# Patient Record
Sex: Male | Born: 1980
Health system: Southern US, Community
[De-identification: ages and names within clinical notes are randomized; demographics above are authoritative.]

## PROBLEM LIST (undated history)

## (undated) DIAGNOSIS — F32A Depression, unspecified: Secondary | ICD-10-CM

## (undated) HISTORY — PX: EYE SURGERY: SHX253

---

## 2016-07-03 DIAGNOSIS — Z6826 Body mass index (BMI) 26.0-26.9, adult: Secondary | ICD-10-CM | POA: Diagnosis not present

## 2016-07-03 DIAGNOSIS — G4700 Insomnia, unspecified: Secondary | ICD-10-CM | POA: Diagnosis not present

## 2016-07-03 DIAGNOSIS — F4010 Social phobia, unspecified: Secondary | ICD-10-CM | POA: Diagnosis not present

## 2016-07-03 DIAGNOSIS — F200 Paranoid schizophrenia: Secondary | ICD-10-CM | POA: Diagnosis not present

## 2016-07-09 DIAGNOSIS — F200 Paranoid schizophrenia: Secondary | ICD-10-CM | POA: Diagnosis not present

## 2016-07-23 DIAGNOSIS — H5213 Myopia, bilateral: Secondary | ICD-10-CM | POA: Diagnosis not present

## 2016-10-29 DIAGNOSIS — F200 Paranoid schizophrenia: Secondary | ICD-10-CM | POA: Diagnosis not present

## 2017-11-26 DIAGNOSIS — G2571 Drug induced akathisia: Secondary | ICD-10-CM | POA: Diagnosis not present

## 2017-11-26 DIAGNOSIS — F200 Paranoid schizophrenia: Secondary | ICD-10-CM | POA: Diagnosis not present

## 2017-12-27 DIAGNOSIS — H1031 Unspecified acute conjunctivitis, right eye: Secondary | ICD-10-CM | POA: Diagnosis not present

## 2018-05-20 DIAGNOSIS — Z131 Encounter for screening for diabetes mellitus: Secondary | ICD-10-CM | POA: Diagnosis not present

## 2018-05-20 DIAGNOSIS — Z0000 Encounter for general adult medical examination without abnormal findings: Secondary | ICD-10-CM | POA: Diagnosis not present

## 2018-06-03 DIAGNOSIS — F209 Schizophrenia, unspecified: Secondary | ICD-10-CM | POA: Diagnosis not present

## 2018-06-03 DIAGNOSIS — G4700 Insomnia, unspecified: Secondary | ICD-10-CM | POA: Diagnosis not present

## 2018-06-03 DIAGNOSIS — F4010 Social phobia, unspecified: Secondary | ICD-10-CM | POA: Diagnosis not present

## 2018-06-03 DIAGNOSIS — Z6826 Body mass index (BMI) 26.0-26.9, adult: Secondary | ICD-10-CM | POA: Diagnosis not present

## 2018-06-17 DIAGNOSIS — D2262 Melanocytic nevi of left upper limb, including shoulder: Secondary | ICD-10-CM | POA: Diagnosis not present

## 2018-06-17 DIAGNOSIS — D1801 Hemangioma of skin and subcutaneous tissue: Secondary | ICD-10-CM | POA: Diagnosis not present

## 2018-06-17 DIAGNOSIS — D1722 Benign lipomatous neoplasm of skin and subcutaneous tissue of left arm: Secondary | ICD-10-CM | POA: Diagnosis not present

## 2018-06-17 DIAGNOSIS — D485 Neoplasm of uncertain behavior of skin: Secondary | ICD-10-CM | POA: Diagnosis not present

## 2018-06-17 DIAGNOSIS — L821 Other seborrheic keratosis: Secondary | ICD-10-CM | POA: Diagnosis not present

## 2018-06-17 DIAGNOSIS — L814 Other melanin hyperpigmentation: Secondary | ICD-10-CM | POA: Diagnosis not present

## 2018-10-28 DIAGNOSIS — F209 Schizophrenia, unspecified: Secondary | ICD-10-CM | POA: Diagnosis not present

## 2018-10-28 DIAGNOSIS — G4700 Insomnia, unspecified: Secondary | ICD-10-CM | POA: Diagnosis not present

## 2018-10-28 DIAGNOSIS — F4010 Social phobia, unspecified: Secondary | ICD-10-CM | POA: Diagnosis not present

## 2018-10-28 DIAGNOSIS — G2571 Drug induced akathisia: Secondary | ICD-10-CM | POA: Diagnosis not present

## 2018-10-31 NOTE — Progress Notes (Signed)
Mr. Melley received his flu shot to the RT deltoid at the [facility] by the undersigned.   Lot #[id number] NDC: [id number] Mfg: [id number] Exp. 03/14/19

## 2019-04-28 DIAGNOSIS — F209 Schizophrenia, unspecified: Secondary | ICD-10-CM | POA: Diagnosis not present

## 2019-04-28 DIAGNOSIS — G2571 Drug induced akathisia: Secondary | ICD-10-CM | POA: Diagnosis not present

## 2019-05-10 DIAGNOSIS — Z20828 Contact with and (suspected) exposure to other viral communicable diseases: Secondary | ICD-10-CM | POA: Diagnosis not present

## 2019-06-15 DIAGNOSIS — Z0000 Encounter for general adult medical examination without abnormal findings: Secondary | ICD-10-CM | POA: Diagnosis not present

## 2019-06-17 DIAGNOSIS — Z20828 Contact with and (suspected) exposure to other viral communicable diseases: Secondary | ICD-10-CM | POA: Diagnosis not present

## 2019-06-23 DIAGNOSIS — Z23 Encounter for immunization: Secondary | ICD-10-CM | POA: Diagnosis not present

## 2019-07-05 DIAGNOSIS — Z13220 Encounter for screening for lipoid disorders: Secondary | ICD-10-CM | POA: Diagnosis not present

## 2019-07-05 DIAGNOSIS — Z0000 Encounter for general adult medical examination without abnormal findings: Secondary | ICD-10-CM | POA: Diagnosis not present

## 2019-08-07 DIAGNOSIS — Z20828 Contact with and (suspected) exposure to other viral communicable diseases: Secondary | ICD-10-CM | POA: Diagnosis not present

## 2019-08-08 DIAGNOSIS — Z20828 Contact with and (suspected) exposure to other viral communicable diseases: Secondary | ICD-10-CM | POA: Diagnosis not present

## 2019-08-25 DIAGNOSIS — R69 Illness, unspecified: Secondary | ICD-10-CM | POA: Diagnosis not present

## 2019-08-25 DIAGNOSIS — G4700 Insomnia, unspecified: Secondary | ICD-10-CM | POA: Diagnosis not present

## 2019-08-25 DIAGNOSIS — G2571 Drug induced akathisia: Secondary | ICD-10-CM | POA: Diagnosis not present

## 2019-09-13 ENCOUNTER — Ambulatory Visit: Payer: 59 | Admitting: Mental Health

## 2019-09-27 DIAGNOSIS — R69 Illness, unspecified: Secondary | ICD-10-CM | POA: Diagnosis not present

## 2019-10-12 DIAGNOSIS — R69 Illness, unspecified: Secondary | ICD-10-CM | POA: Diagnosis not present

## 2019-10-26 DIAGNOSIS — R69 Illness, unspecified: Secondary | ICD-10-CM | POA: Diagnosis not present

## 2019-10-27 DIAGNOSIS — R69 Illness, unspecified: Secondary | ICD-10-CM | POA: Diagnosis not present

## 2019-10-27 DIAGNOSIS — G2571 Drug induced akathisia: Secondary | ICD-10-CM | POA: Diagnosis not present

## 2019-10-27 DIAGNOSIS — G4700 Insomnia, unspecified: Secondary | ICD-10-CM | POA: Diagnosis not present

## 2019-11-09 DIAGNOSIS — R69 Illness, unspecified: Secondary | ICD-10-CM | POA: Diagnosis not present

## 2019-11-10 DIAGNOSIS — L814 Other melanin hyperpigmentation: Secondary | ICD-10-CM | POA: Diagnosis not present

## 2019-11-10 DIAGNOSIS — D2262 Melanocytic nevi of left upper limb, including shoulder: Secondary | ICD-10-CM | POA: Diagnosis not present

## 2019-11-10 DIAGNOSIS — D2271 Melanocytic nevi of right lower limb, including hip: Secondary | ICD-10-CM | POA: Diagnosis not present

## 2019-11-10 DIAGNOSIS — D2261 Melanocytic nevi of right upper limb, including shoulder: Secondary | ICD-10-CM | POA: Diagnosis not present

## 2019-11-10 DIAGNOSIS — D225 Melanocytic nevi of trunk: Secondary | ICD-10-CM | POA: Diagnosis not present

## 2019-11-10 DIAGNOSIS — D1801 Hemangioma of skin and subcutaneous tissue: Secondary | ICD-10-CM | POA: Diagnosis not present

## 2019-11-10 DIAGNOSIS — L820 Inflamed seborrheic keratosis: Secondary | ICD-10-CM | POA: Diagnosis not present

## 2019-11-10 DIAGNOSIS — L821 Other seborrheic keratosis: Secondary | ICD-10-CM | POA: Diagnosis not present

## 2019-11-30 DIAGNOSIS — R69 Illness, unspecified: Secondary | ICD-10-CM | POA: Diagnosis not present

## 2019-12-08 DIAGNOSIS — G2571 Drug induced akathisia: Secondary | ICD-10-CM | POA: Diagnosis not present

## 2019-12-08 DIAGNOSIS — G4700 Insomnia, unspecified: Secondary | ICD-10-CM | POA: Diagnosis not present

## 2019-12-08 DIAGNOSIS — R69 Illness, unspecified: Secondary | ICD-10-CM | POA: Diagnosis not present

## 2019-12-21 DIAGNOSIS — R69 Illness, unspecified: Secondary | ICD-10-CM | POA: Diagnosis not present

## 2020-01-03 DIAGNOSIS — R69 Illness, unspecified: Secondary | ICD-10-CM | POA: Diagnosis not present

## 2020-01-03 DIAGNOSIS — Z8659 Personal history of other mental and behavioral disorders: Secondary | ICD-10-CM | POA: Diagnosis not present

## 2020-01-19 DIAGNOSIS — M79642 Pain in left hand: Secondary | ICD-10-CM | POA: Diagnosis not present

## 2020-01-19 DIAGNOSIS — M79641 Pain in right hand: Secondary | ICD-10-CM | POA: Diagnosis not present

## 2020-01-25 DIAGNOSIS — R69 Illness, unspecified: Secondary | ICD-10-CM | POA: Diagnosis not present

## 2020-01-26 DIAGNOSIS — R69 Illness, unspecified: Secondary | ICD-10-CM | POA: Diagnosis not present

## 2020-01-26 DIAGNOSIS — G2571 Drug induced akathisia: Secondary | ICD-10-CM | POA: Diagnosis not present

## 2020-02-02 DIAGNOSIS — R69 Illness, unspecified: Secondary | ICD-10-CM | POA: Diagnosis not present

## 2020-02-15 DIAGNOSIS — R69 Illness, unspecified: Secondary | ICD-10-CM | POA: Diagnosis not present

## 2020-02-27 DIAGNOSIS — F419 Anxiety disorder, unspecified: Secondary | ICD-10-CM | POA: Diagnosis not present

## 2020-02-27 DIAGNOSIS — R69 Illness, unspecified: Secondary | ICD-10-CM | POA: Diagnosis not present

## 2020-02-27 DIAGNOSIS — G2571 Drug induced akathisia: Secondary | ICD-10-CM | POA: Diagnosis not present

## 2020-02-29 DIAGNOSIS — R69 Illness, unspecified: Secondary | ICD-10-CM | POA: Diagnosis not present

## 2020-03-27 DIAGNOSIS — K649 Unspecified hemorrhoids: Secondary | ICD-10-CM | POA: Diagnosis not present

## 2020-04-05 DIAGNOSIS — R69 Illness, unspecified: Secondary | ICD-10-CM | POA: Diagnosis not present

## 2020-04-23 DIAGNOSIS — R05 Cough: Secondary | ICD-10-CM | POA: Diagnosis not present

## 2020-04-23 DIAGNOSIS — R509 Fever, unspecified: Secondary | ICD-10-CM | POA: Diagnosis not present

## 2020-04-23 DIAGNOSIS — Z03818 Encounter for observation for suspected exposure to other biological agents ruled out: Secondary | ICD-10-CM | POA: Diagnosis not present

## 2020-04-23 DIAGNOSIS — J029 Acute pharyngitis, unspecified: Secondary | ICD-10-CM | POA: Diagnosis not present

## 2020-05-09 DIAGNOSIS — R509 Fever, unspecified: Secondary | ICD-10-CM | POA: Diagnosis not present

## 2020-05-09 DIAGNOSIS — R112 Nausea with vomiting, unspecified: Secondary | ICD-10-CM | POA: Diagnosis not present

## 2020-05-09 DIAGNOSIS — R5381 Other malaise: Secondary | ICD-10-CM | POA: Diagnosis not present

## 2020-05-17 DIAGNOSIS — R69 Illness, unspecified: Secondary | ICD-10-CM | POA: Diagnosis not present

## 2020-06-17 DIAGNOSIS — M25541 Pain in joints of right hand: Secondary | ICD-10-CM | POA: Diagnosis not present

## 2020-06-17 DIAGNOSIS — Z0000 Encounter for general adult medical examination without abnormal findings: Secondary | ICD-10-CM | POA: Diagnosis not present

## 2020-06-17 DIAGNOSIS — Z23 Encounter for immunization: Secondary | ICD-10-CM | POA: Diagnosis not present

## 2020-06-17 DIAGNOSIS — M549 Dorsalgia, unspecified: Secondary | ICD-10-CM | POA: Diagnosis not present

## 2020-06-17 DIAGNOSIS — M25542 Pain in joints of left hand: Secondary | ICD-10-CM | POA: Diagnosis not present

## 2020-06-19 DIAGNOSIS — R69 Illness, unspecified: Secondary | ICD-10-CM | POA: Diagnosis not present

## 2020-07-05 DIAGNOSIS — Z23 Encounter for immunization: Secondary | ICD-10-CM | POA: Diagnosis not present

## 2020-07-05 DIAGNOSIS — M25541 Pain in joints of right hand: Secondary | ICD-10-CM | POA: Diagnosis not present

## 2020-07-05 DIAGNOSIS — M25542 Pain in joints of left hand: Secondary | ICD-10-CM | POA: Diagnosis not present

## 2020-07-05 DIAGNOSIS — Z0000 Encounter for general adult medical examination without abnormal findings: Secondary | ICD-10-CM | POA: Diagnosis not present

## 2020-07-05 DIAGNOSIS — Z13220 Encounter for screening for lipoid disorders: Secondary | ICD-10-CM | POA: Diagnosis not present

## 2020-07-05 DIAGNOSIS — M549 Dorsalgia, unspecified: Secondary | ICD-10-CM | POA: Diagnosis not present

## 2020-08-15 DIAGNOSIS — R69 Illness, unspecified: Secondary | ICD-10-CM | POA: Diagnosis not present

## 2020-09-12 DIAGNOSIS — M7989 Other specified soft tissue disorders: Secondary | ICD-10-CM | POA: Diagnosis not present

## 2020-09-16 DIAGNOSIS — R059 Cough, unspecified: Secondary | ICD-10-CM | POA: Diagnosis not present

## 2020-09-16 DIAGNOSIS — Z03818 Encounter for observation for suspected exposure to other biological agents ruled out: Secondary | ICD-10-CM | POA: Diagnosis not present

## 2020-09-16 DIAGNOSIS — Z7189 Other specified counseling: Secondary | ICD-10-CM | POA: Diagnosis not present

## 2020-10-17 DIAGNOSIS — R69 Illness, unspecified: Secondary | ICD-10-CM | POA: Diagnosis not present

## 2020-10-18 DIAGNOSIS — R69 Illness, unspecified: Secondary | ICD-10-CM | POA: Diagnosis not present

## 2020-11-08 ENCOUNTER — Other Ambulatory Visit: Payer: Self-pay

## 2020-11-08 ENCOUNTER — Encounter (HOSPITAL_COMMUNITY): Payer: Self-pay

## 2020-11-08 ENCOUNTER — Ambulatory Visit (HOSPITAL_COMMUNITY)
Admission: EM | Admit: 2020-11-08 | Discharge: 2020-11-08 | Disposition: A | Discharge status: Home / Self Care | Payer: 59

## 2020-11-08 DIAGNOSIS — K529 Noninfective gastroenteritis and colitis, unspecified: Secondary | ICD-10-CM

## 2020-11-08 DIAGNOSIS — R112 Nausea with vomiting, unspecified: Secondary | ICD-10-CM | POA: Diagnosis not present

## 2020-11-08 DIAGNOSIS — R197 Diarrhea, unspecified: Secondary | ICD-10-CM

## 2020-11-08 MED ORDER — ONDANSETRON 4 MG PO TBDP: 4.0000 mg | ORAL_TABLET | Freq: Three times a day (TID) | ORAL | 0 refills | Status: DC | PRN

## 2020-11-08 MED ORDER — LOPERAMIDE HCL 2 MG PO CAPS: 2.0000 mg | ORAL_CAPSULE | Freq: Four times a day (QID) | ORAL | 0 refills | Status: DC | PRN

## 2020-11-08 NOTE — ED Provider Notes (Signed)
[facility]    CSN: [id number] Arrival date & time: 11/08/20  1004      History   Chief Complaint Chief Complaint  Patient presents with  . Abdominal Pain  . Emesis  . Diarrhea    HPI Glenn Hodge is a 40 y.o. male presenting with generalized crampy abdominal pain, vomiting, diarrhea for 12 hours.  States he has already taken Zofran ODT 4 mg, tylenol, and Imodium for the symptoms, with relief of symptoms.  He states he is feeling well right now.  Denies URI symptoms, and states that he had Covid 1 month ago. Denies current fevers/chills, n/v/d, shortness of breath, chest pain, cough, congestion, facial pain, teeth pain, headaches, sore throat, loss of taste/smell, swollen lymph nodes, ear pain. Requesting refill of zofran and immodium in case symptoms return.   HPI  History reviewed. No pertinent past medical history.  There are no problems to display for this patient.   History reviewed. No pertinent surgical history.     Home Medications    Prior to Admission medications   Medication Sig Start Date End Date Taking? Authorizing Provider  ARIPiprazole (ABILIFY) 10 MG tablet aripiprazole 10 mg tablet 08/25/20  Yes [provider]  Doxepin HCl 3 MG TABS doxepin 3 mg tablet 08/26/20  Yes [provider]  loperamide (IMODIUM) 2 MG capsule Take 1 capsule (2 mg total) by mouth 4 (four) times daily as needed for diarrhea or loose stools. 11/08/20  Yes Rhys Martini, PA-C  ondansetron (ZOFRAN ODT) 4 MG disintegrating tablet Take 1 tablet (4 mg total) by mouth every 8 (eight) hours as needed for nausea or vomiting. 11/08/20  Yes Rhys Martini, PA-C    Family History History reviewed. No pertinent family history.  Social History Social History   Tobacco Use  . Smoking status: Never Smoker  . Smokeless tobacco: Never Used  Substance Use Topics  . Drug use: Never     Allergies   Patient has no known allergies.   Review of Systems Review  of Systems  Constitutional: Negative for appetite change, chills and fever.  HENT: Negative for congestion, ear pain, rhinorrhea, sinus pressure, sinus pain and sore throat.   Eyes: Negative for redness and visual disturbance.  Respiratory: Negative for cough, chest tightness, shortness of breath and wheezing.   Cardiovascular: Negative for chest pain and palpitations.  Gastrointestinal: Positive for abdominal pain, diarrhea, nausea and vomiting. Negative for constipation.  Genitourinary: Negative for dysuria, frequency and urgency.  Musculoskeletal: Negative for myalgias.  Neurological: Negative for dizziness, weakness and headaches.  Psychiatric/Behavioral: Negative for confusion.  All other systems reviewed and are negative.    Physical Exam Triage Vital Signs ED Triage Vitals  Enc Vitals Group     BP 11/08/20 1037 121/68     Pulse Rate 11/08/20 1037 89     Resp 11/08/20 1037 19     Temp 11/08/20 1037 98.3 F (36.8 C)     Temp src --      SpO2 11/08/20 1037 99 %     Weight --      Height --      Head Circumference --      Peak Flow --      Pain Score 11/08/20 1035 1     Pain Loc --      Pain Edu? --      Excl. in GC? --    No data found.  Updated Vital Signs BP 121/68   Pulse  89   Temp 98.3 F (36.8 C)   Resp 19   SpO2 99%   Visual Acuity Right Eye Distance:   Left Eye Distance:   Bilateral Distance:    Right Eye Near:   Left Eye Near:    Bilateral Near:     Physical Exam Vitals reviewed.  Constitutional:      General: He is not in acute distress.    Appearance: Normal appearance. He is not ill-appearing.  HENT:     Head: Normocephalic and atraumatic.  Cardiovascular:     Rate and Rhythm: Normal rate and regular rhythm.     Heart sounds: Normal heart sounds.  Pulmonary:     Effort: Pulmonary effort is normal.     Breath sounds: Normal breath sounds. No wheezing, rhonchi or rales.  Abdominal:     General: Bowel sounds are normal. There is no  distension.     Palpations: Abdomen is soft. There is no mass.     Tenderness: There is no abdominal tenderness. There is no right CVA tenderness, left CVA tenderness, guarding or rebound. Negative signs include Murphy's sign, Rovsing's sign and McBurney's sign.  Neurological:     General: No focal deficit present.     Mental Status: He is alert and oriented to person, place, and time.  Psychiatric:        Mood and Affect: Mood normal.        Behavior: Behavior normal.      UC Treatments / Results  Labs (all labs ordered are listed, but only abnormal results are displayed) Labs Reviewed - No data to display  EKG   Radiology No results found.  Procedures Procedures (including critical care time)  Medications Ordered in UC Medications - No data to display  Initial Impression / Assessment and Plan / UC Course  I have reviewed the triage vital signs and the nursing notes.  Pertinent labs & imaging results that were available during my care of the patient were reviewed by me and considered in my medical decision making (see chart for details).     This patient is a 40 year old male presenting with viral gastroenteritis.  Today he is afebrile nontachycardic and nontachypneic. zofran ODT and imodium providing relief; refills sent as below. Continue these. rec good hydration.    This patient had  covid-19 1 month ago and is fully vaccinated for covid, so additional test deferred as per CDC guidelines.   Return precautions-inability to hydrate by mouth, worsening of symptoms despite treatment, focal abdominal pain, new fever/chills, etc.  Spent over 40 minutes obtaining H&P, performing physical, discussing results, treatment plan and plan for follow-up with patient. Patient agrees with plan.     Final Clinical Impressions(s) / UC Diagnoses   Final diagnoses:  Gastroenteritis  Nausea vomiting and diarrhea     Discharge Instructions     -Continue Zofran for  nausea/vomiting, dissolve 1 pill under your tongue every 4-8 hours as needed. -Also continue imodium, up to 4x daily for diarrea. -You can also continue OTC medications like Tums or Pepto Bismol. -Make sure to drink plenty of fluids and eat a bland diet as tolerated.    ED Prescriptions    Medication Sig Dispense Auth. Provider   ondansetron (ZOFRAN ODT) 4 MG disintegrating tablet Take 1 tablet (4 mg total) by mouth every 8 (eight) hours as needed for nausea or vomiting. 21 tablet Rhys Martini, PA-C   loperamide (IMODIUM) 2 MG capsule Take 1 capsule (2 mg total)  by mouth 4 (four) times daily as needed for diarrhea or loose stools. 21 capsule Rhys Martini, PA-C     PDMP not reviewed this encounter.   Rhys Martini, PA-C 11/08/20 1121

## 2020-11-08 NOTE — ED Triage Notes (Signed)
Pt in with c/o generalized abdominal pain and vomiting that started last night and is now resolving   Pt took tylenol and zofran and imodium this morning for sxs

## 2020-11-08 NOTE — Discharge Instructions (Addendum)
-  Continue Zofran for nausea/vomiting, dissolve 1 pill under your tongue every 4-8 hours as needed. -Also continue imodium, up to 4x daily for diarrea. -You can also continue OTC medications like Tums or Pepto Bismol. -Make sure to drink plenty of fluids and eat a bland diet as tolerated.

## 2020-11-21 DIAGNOSIS — R69 Illness, unspecified: Secondary | ICD-10-CM | POA: Diagnosis not present

## 2020-12-12 DIAGNOSIS — M25511 Pain in right shoulder: Secondary | ICD-10-CM | POA: Diagnosis not present

## 2021-05-02 ENCOUNTER — Encounter (HOSPITAL_BASED_OUTPATIENT_CLINIC_OR_DEPARTMENT_OTHER): Payer: Self-pay | Admitting: Emergency Medicine

## 2021-05-02 ENCOUNTER — Emergency Department (HOSPITAL_BASED_OUTPATIENT_CLINIC_OR_DEPARTMENT_OTHER): Payer: Managed Care, Other (non HMO)

## 2021-05-02 ENCOUNTER — Emergency Department (HOSPITAL_BASED_OUTPATIENT_CLINIC_OR_DEPARTMENT_OTHER)
Admission: EM | Admit: 2021-05-02 | Discharge: 2021-05-02 | Disposition: A | Discharge status: Home / Self Care | Payer: Managed Care, Other (non HMO) | Attending: Emergency Medicine | Admitting: Emergency Medicine

## 2021-05-02 ENCOUNTER — Other Ambulatory Visit: Payer: Self-pay

## 2021-05-02 DIAGNOSIS — R42 Dizziness and giddiness: Secondary | ICD-10-CM | POA: Diagnosis not present

## 2021-05-02 DIAGNOSIS — R251 Tremor, unspecified: Secondary | ICD-10-CM | POA: Diagnosis not present

## 2021-05-02 DIAGNOSIS — Z20822 Contact with and (suspected) exposure to covid-19: Secondary | ICD-10-CM | POA: Diagnosis not present

## 2021-05-02 DIAGNOSIS — R791 Abnormal coagulation profile: Secondary | ICD-10-CM | POA: Insufficient documentation

## 2021-05-02 HISTORY — DX: Depression, unspecified: F32.A

## 2021-05-02 LAB — PROTIME-INR
INR: 0.9 (ref 0.8–1.2)
Prothrombin Time: 12.3 seconds (ref 11.4–15.2)

## 2021-05-02 LAB — URINALYSIS, ROUTINE W REFLEX MICROSCOPIC
Bilirubin Urine: NEGATIVE
Glucose, UA: NEGATIVE mg/dL
Hgb urine dipstick: NEGATIVE
Ketones, ur: NEGATIVE mg/dL
Leukocytes,Ua: NEGATIVE
Nitrite: NEGATIVE
Protein, ur: NEGATIVE mg/dL
Specific Gravity, Urine: 1.005 (ref 1.005–1.030)
pH: 5.5 (ref 5.0–8.0)

## 2021-05-02 LAB — CBC
HCT: 47.4 % (ref 39.0–52.0)
Hemoglobin: 15.4 g/dL (ref 13.0–17.0)
MCH: 29.7 pg (ref 26.0–34.0)
MCHC: 32.5 g/dL (ref 30.0–36.0)
MCV: 91.3 fL (ref 80.0–100.0)
Platelets: 204 10*3/uL (ref 150–400)
RBC: 5.19 MIL/uL (ref 4.22–5.81)
RDW: 12.2 % (ref 11.5–15.5)
WBC: 5 10*3/uL (ref 4.0–10.5)
nRBC: 0 % (ref 0.0–0.2)

## 2021-05-02 LAB — RAPID URINE DRUG SCREEN, HOSP PERFORMED
Amphetamines: NOT DETECTED
Barbiturates: NOT DETECTED
Benzodiazepines: NOT DETECTED
Cocaine: NOT DETECTED
Opiates: NOT DETECTED
Tetrahydrocannabinol: NOT DETECTED

## 2021-05-02 LAB — DIFFERENTIAL
Abs Immature Granulocytes: 0.01 10*3/uL (ref 0.00–0.07)
Basophils Absolute: 0 10*3/uL (ref 0.0–0.1)
Basophils Relative: 1 %
Eosinophils Absolute: 0.1 10*3/uL (ref 0.0–0.5)
Eosinophils Relative: 2 %
Immature Granulocytes: 0 %
Lymphocytes Relative: 22 %
Lymphs Abs: 1.1 10*3/uL (ref 0.7–4.0)
Monocytes Absolute: 0.4 10*3/uL (ref 0.1–1.0)
Monocytes Relative: 8 %
Neutro Abs: 3.4 10*3/uL (ref 1.7–7.7)
Neutrophils Relative %: 67 %

## 2021-05-02 LAB — COMPREHENSIVE METABOLIC PANEL
ALT: 23 U/L (ref 0–44)
AST: 19 U/L (ref 15–41)
Albumin: 4.2 g/dL (ref 3.5–5.0)
Alkaline Phosphatase: 77 U/L (ref 38–126)
Anion gap: 7 (ref 5–15)
BUN: 16 mg/dL (ref 6–20)
CO2: 32 mmol/L (ref 22–32)
Calcium: 9.1 mg/dL (ref 8.9–10.3)
Chloride: 98 mmol/L (ref 98–111)
Creatinine, Ser: 1.09 mg/dL (ref 0.61–1.24)
GFR, Estimated: >60 mL/min (ref >60)
Glucose, Bld: 132 mg/dL — ABNORMAL HIGH (ref 70–99)
Potassium: 3.7 mmol/L (ref 3.5–5.1)
Sodium: 137 mmol/L (ref 135–145)
Total Bilirubin: 0.6 mg/dL (ref 0.3–1.2)
Total Protein: 6.4 g/dL — ABNORMAL LOW (ref 6.5–8.1)

## 2021-05-02 LAB — RESP PANEL BY RT-PCR (FLU A&B, COVID) ARPGX2
Influenza A by PCR: NEGATIVE
Influenza B by PCR: NEGATIVE
SARS Coronavirus 2 by RT PCR: NEGATIVE

## 2021-05-02 LAB — ETHANOL: Alcohol, Ethyl (B): <10 mg/dL (ref <10)

## 2021-05-02 LAB — APTT: aPTT: 33 seconds (ref 24–36)

## 2021-05-02 MED ORDER — SODIUM CHLORIDE 0.9 % IV SOLN: 100.0000 mL/h | INTRAVENOUS | Status: DC

## 2021-05-02 MED ORDER — LORAZEPAM 2 MG/ML IJ SOLN
1.0000 mg | Freq: Once | INTRAMUSCULAR | Status: AC
  Administered 2021-05-02: 1 mg via INTRAVENOUS
  Filled 2021-05-02: qty 1

## 2021-05-02 MED ORDER — MECLIZINE HCL 25 MG PO TABS: 25.0000 mg | ORAL_TABLET | Freq: Three times a day (TID) | ORAL | 0 refills | Status: AC | PRN

## 2021-05-02 MED ORDER — SODIUM CHLORIDE 0.9 % IV BOLUS
500.0000 mL | Freq: Once | INTRAVENOUS | Status: AC
  Administered 2021-05-02: 500 mL via INTRAVENOUS

## 2021-05-02 NOTE — ED Notes (Signed)
Ambulated in hall, denies loss of balance or dizziness but still doesn't feel quite right. ED MD informed, gait steady without assist

## 2021-05-02 NOTE — ED Triage Notes (Addendum)
States woke up about about 0540 am and stood up and fell backwards , dizzy and laid down. Feels like balance off, denies any hx of vertigo. Denies n/v. Tremor to hands ,  states is due to medication

## 2021-05-02 NOTE — ED Notes (Signed)
Patient transported to CT with RN 

## 2021-05-02 NOTE — ED Notes (Signed)
ED Provider at bedside. 

## 2021-05-02 NOTE — ED Provider Notes (Signed)
[facility] EMERGENCY DEPT Provider Note   CSN: [id number] Arrival date & time: 05/02/21  6222     History Chief Complaint  Patient presents with   Dizziness    Glenn Hodge is a 40 y.o. male.  Pt presents to the ED today with feeling off balance.  Pt said he was normal when he went to bed.  He awoke at 0540.  He said he stood up and fell backwards onto his bed.  He said he laid there for a little while and got back up.  He was able to get in the shower, but still feels off balance.  Pt said he feels like someone is pulling him backwards.  He denies any worsening of sx with head movement.  Nothing like this has happened in the past.        Past Medical History:  Diagnosis Date   Depression     There are no problems to display for this patient.   Past Surgical History:  Procedure Laterality Date   EYE SURGERY         No family history on file.  Social History   Tobacco Use   Smoking status: Never   Smokeless tobacco: Never  Substance Use Topics   Alcohol use: Yes    Alcohol/week: 3.0 standard drinks    Types: 3 Standard drinks or equivalent per week    Comment: 3 drinks a week   Drug use: Never    Home Medications Prior to Admission medications   Medication Sig Start Date End Date Taking? Authorizing Provider  ARIPiprazole (ABILIFY) 10 MG tablet aripiprazole 10 mg tablet 08/25/20  Yes [provider]  Doxepin HCl 3 MG TABS doxepin 3 mg tablet 08/26/20  Yes [provider]  hydrOXYzine (ATARAX/VISTARIL) 25 MG tablet hydroxyzine HCl 25 mg tablet  TAKE 1 TABLET (25 MG TOTAL) BY MOUTH THREE (3) TIMES A DAY AS NEEDED FOR ANXIETY. 05/17/20  Yes [provider]  meclizine (ANTIVERT) 25 MG tablet Take 1 tablet (25 mg total) by mouth 3 (three) times daily as needed for dizziness. 05/02/21  Yes Jacalyn Lefevre, MD  loperamide (IMODIUM) 2 MG capsule Take 1 capsule (2 mg total) by mouth 4 (four) times daily as needed for diarrhea or  loose stools. 11/08/20   Rhys Martini, PA-C  ondansetron (ZOFRAN ODT) 4 MG disintegrating tablet Take 1 tablet (4 mg total) by mouth every 8 (eight) hours as needed for nausea or vomiting. 11/08/20   Rhys Martini, PA-C    Allergies    Patient has no known allergies.  Review of Systems   Review of Systems  Neurological:  Positive for dizziness.       Off balance  All other systems reviewed and are negative.  Physical Exam Updated Vital Signs BP 107/65   Pulse (!) 47   Temp (!) 97.5 F (36.4 C) (Oral)   Resp 12   Ht 6' (1.829 m)   Wt 89.5 kg   SpO2 100%   BMI 26.77 kg/m   Physical Exam Vitals and nursing note reviewed.  Constitutional:      Appearance: Normal appearance.  HENT:     Head: Normocephalic and atraumatic.     Right Ear: Tympanic membrane, ear canal and external ear normal.     Left Ear: Tympanic membrane, ear canal and external ear normal.     Nose: Nose normal.     Mouth/Throat:     Mouth: Mucous membranes are moist.  Pharynx: Oropharynx is clear.  Eyes:     Extraocular Movements: Extraocular movements intact.     Conjunctiva/sclera: Conjunctivae normal.     Pupils: Pupils are equal, round, and reactive to light.  Cardiovascular:     Rate and Rhythm: Normal rate and regular rhythm.     Pulses: Normal pulses.     Heart sounds: Normal heart sounds.  Pulmonary:     Effort: Pulmonary effort is normal.     Breath sounds: Normal breath sounds.  Abdominal:     General: Abdomen is flat. Bowel sounds are normal.     Palpations: Abdomen is soft.  Musculoskeletal:        General: Normal range of motion.     Cervical back: Normal range of motion and neck supple.  Skin:    General: Skin is warm.     Capillary Refill: Capillary refill takes less than 2 seconds.  Neurological:     General: No focal deficit present.     Mental Status: He is alert and oriented to person, place, and time.     Motor: Tremor present.     Comments: Tremor in both arms (L>R)  for several months.  Thought to be due to meds.  Psychiatric:        Mood and Affect: Mood normal.        Behavior: Behavior normal.    ED Results / Procedures / Treatments   Labs (all labs ordered are listed, but only abnormal results are displayed) Labs Reviewed  COMPREHENSIVE METABOLIC PANEL - Abnormal; Notable for the following components:      Result Value   Glucose, Bld 132 (*)    Total Protein 6.4 (*)    All other components within normal limits  URINALYSIS, ROUTINE W REFLEX MICROSCOPIC - Abnormal; Notable for the following components:   Color, Urine COLORLESS (*)    All other components within normal limits  RESP PANEL BY RT-PCR (FLU A&B, COVID) ARPGX2  ETHANOL  PROTIME-INR  APTT  CBC  DIFFERENTIAL  RAPID URINE DRUG SCREEN, HOSP PERFORMED    EKG EKG Interpretation  Date/Time:  Friday May 02 2021 07:50:16 EDT Ventricular Rate:  62 PR Interval:  162 QRS Duration: 105 QT Interval:  421 QTC Calculation: 428 R Axis:   100 Text Interpretation: Sinus rhythm Consider left atrial enlargement Right axis deviation No old tracing to compare Confirmed by Jacalyn Lefevre [phone]) on 05/02/2021 7:52:20 AM  Radiology CT HEAD WO CONTRAST  Result Date: 05/02/2021 CLINICAL DATA:  Patient welcome, felt dizzy and laid back down. Balance issues. Tremor and hands. EXAM: CT HEAD WITHOUT CONTRAST TECHNIQUE: Contiguous axial images were obtained from the base of the skull through the vertex without intravenous contrast. COMPARISON:  None. FINDINGS: Brain: No evidence of acute infarction, hemorrhage, hydrocephalus, extra-axial collection or mass lesion/mass effect. Vascular: No hyperdense vessel or unexpected calcification. Skull: Normal. Negative for fracture or focal lesion. Sinuses/Orbits: Paranasal sinuses and mastoid air cells are clear. Orbits appear normal. Other: None IMPRESSION: No acute intracranial abnormalities. Normal brain. Electronically Signed   By: Signa Kell M.D.   On:  05/02/2021 09:27    Procedures Procedures   Medications Ordered in ED Medications  sodium chloride 0.9 % bolus 500 mL (0 mLs Intravenous Stopped 05/02/21 1009)    Followed by  0.9 %  sodium chloride infusion (has no administration in time range)  LORazepam (ATIVAN) injection 1 mg (1 mg Intravenous Given 05/02/21 8101)    ED Course  I have reviewed  the triage vital signs and the nursing notes.  Pertinent labs & imaging results that were available during my care of the patient were reviewed by me and considered in my medical decision making (see chart for details).    MDM Rules/Calculators/A&P                           Pt is feeling much better.  Work up neg. He is able to ambulate without problems.  He knows that if sx worsen, he will need a MRI.  He is stable for d/c.  Return if worse. Final Clinical Impression(s) / ED Diagnoses Final diagnoses:  Vertigo    Rx / DC Orders ED Discharge Orders          Ordered    meclizine (ANTIVERT) 25 MG tablet  3 times daily PRN        05/02/21 1019             Jacalyn Lefevre, MD 05/02/21 1020

## 2022-02-12 DIAGNOSIS — F209 Schizophrenia, unspecified: Secondary | ICD-10-CM | POA: Diagnosis not present

## 2022-03-10 DIAGNOSIS — L814 Other melanin hyperpigmentation: Secondary | ICD-10-CM | POA: Diagnosis not present

## 2022-03-10 DIAGNOSIS — L821 Other seborrheic keratosis: Secondary | ICD-10-CM | POA: Diagnosis not present

## 2022-03-10 DIAGNOSIS — B078 Other viral warts: Secondary | ICD-10-CM | POA: Diagnosis not present

## 2022-03-10 DIAGNOSIS — L820 Inflamed seborrheic keratosis: Secondary | ICD-10-CM | POA: Diagnosis not present

## 2022-03-10 DIAGNOSIS — Z789 Other specified health status: Secondary | ICD-10-CM | POA: Diagnosis not present

## 2022-03-10 DIAGNOSIS — D1801 Hemangioma of skin and subcutaneous tissue: Secondary | ICD-10-CM | POA: Diagnosis not present

## 2022-03-10 DIAGNOSIS — L538 Other specified erythematous conditions: Secondary | ICD-10-CM | POA: Diagnosis not present

## 2022-03-12 DIAGNOSIS — Z302 Encounter for sterilization: Secondary | ICD-10-CM | POA: Diagnosis not present

## 2022-03-21 IMAGING — CT CT HEAD W/O CM
4 series · 16 of 47 positions shown, 18 images · non-contrast
Comparison: None.

CLINICAL DATA: Patient welcome, felt dizzy and laid back down.
Balance issues. Tremor and hands.

EXAM:
CT HEAD WITHOUT CONTRAST
TECHNIQUE: Contiguous axial images were obtained from the base of the skull
through the vertex without intravenous contrast.

[Series 2: head wo · axial · 0.45mm/px · z∈[-358,-248]mm · 7 of 30 slices shown, 9 images]
[im 4/30  brain]
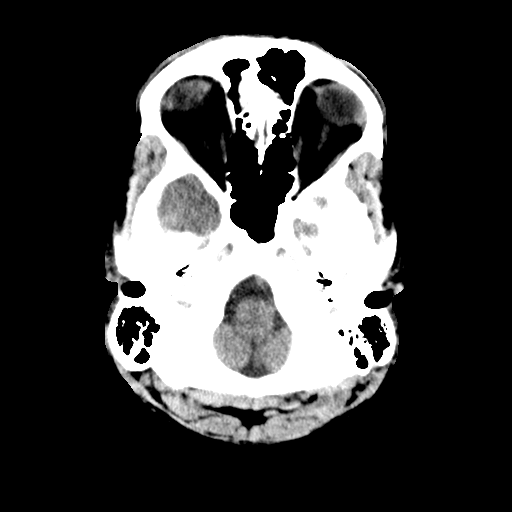
[im 4/30  bone]
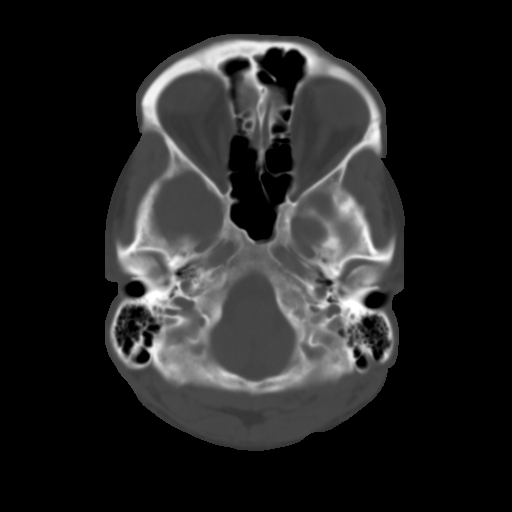
[im 8/30  brain]
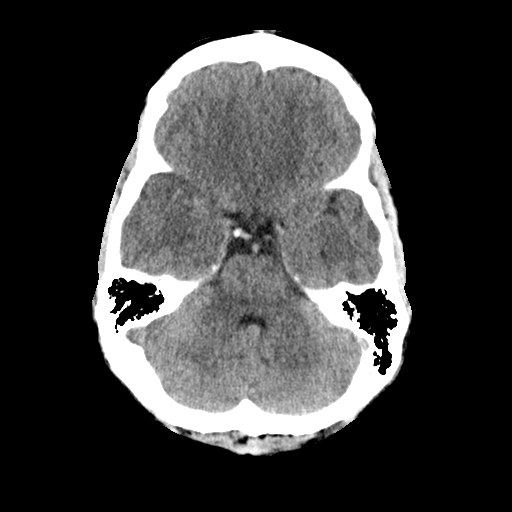
[im 11/30  brain]
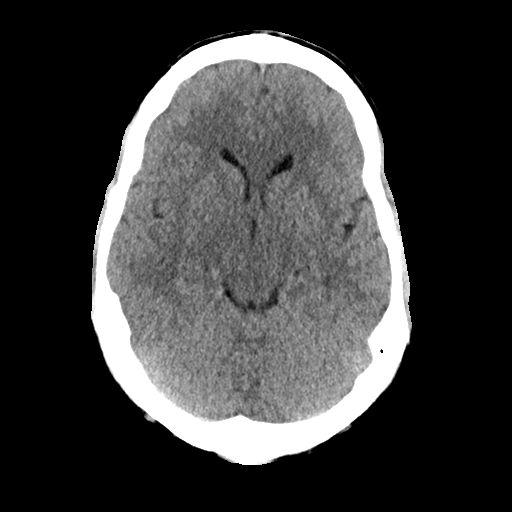
[im 15/30  brain]
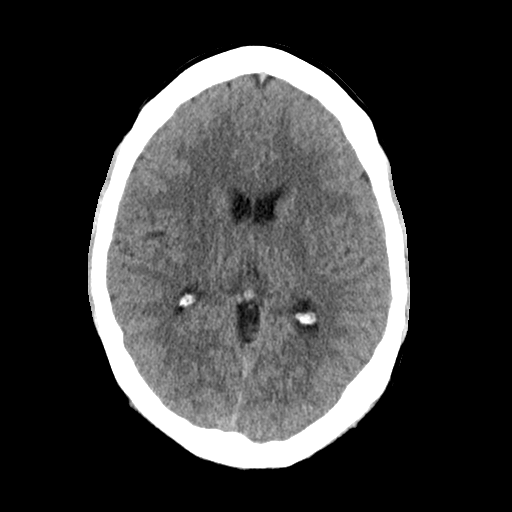
[im 19/30  brain]
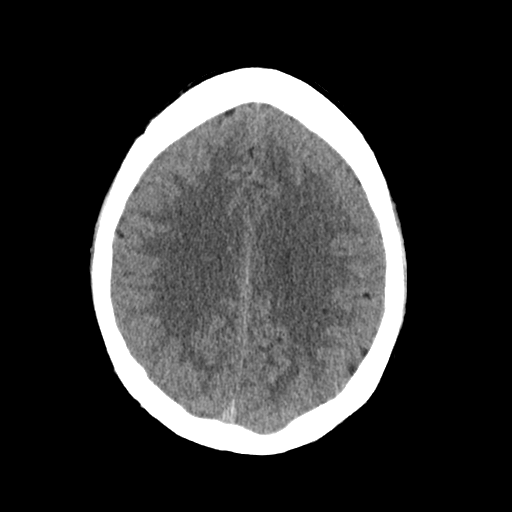
[im 19/30  bone]
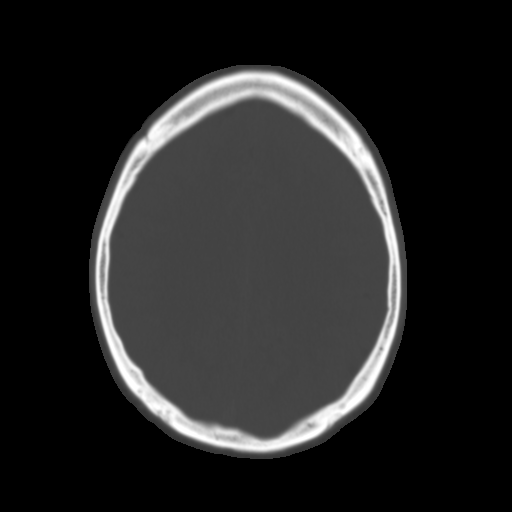
[im 22/30  brain]
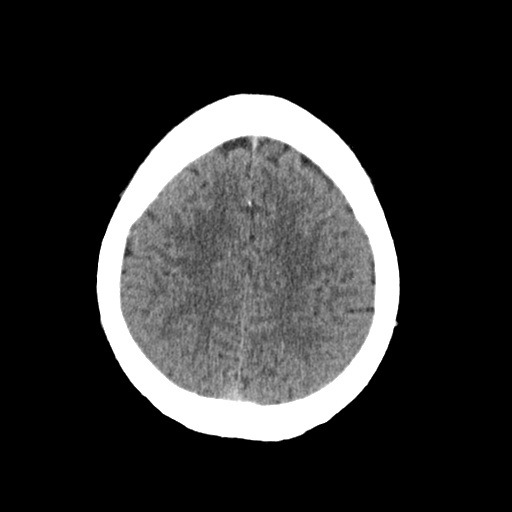
[im 26/30  brain]
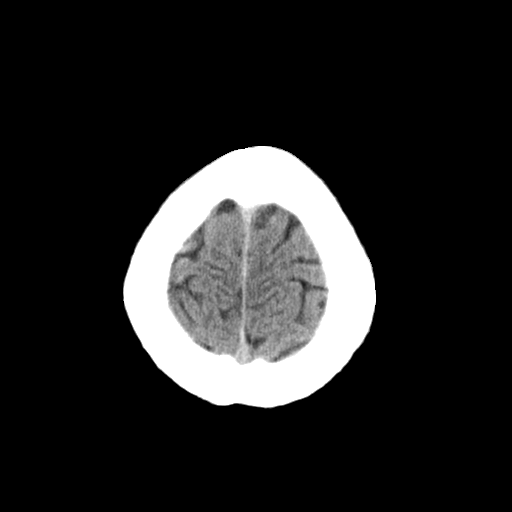

[Series 3: head bone · axial · 0.45mm/px · z∈[-359,-329]mm · 3 of 75 slices shown]
[im 8/75  bone]
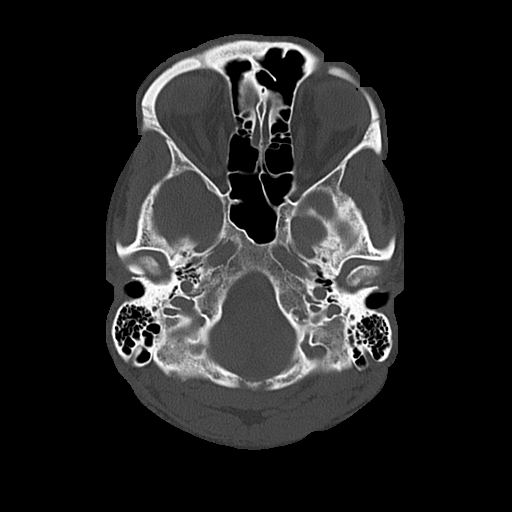
[im 15/75  bone]
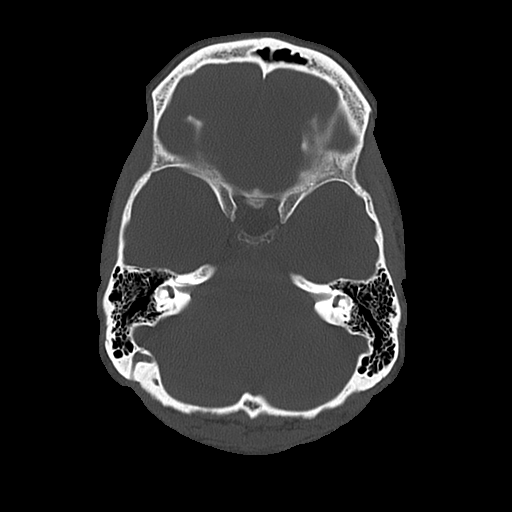
[im 23/75  bone]
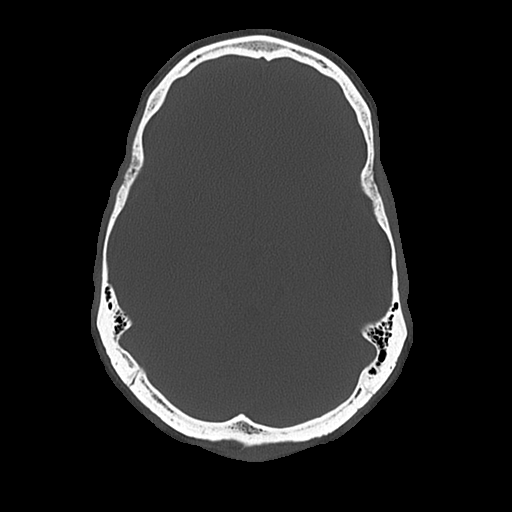

[Series 4: coronal soft · coronal · 0.30mm/px · 3 of 70 slices shown]
[im 24/70  brain]
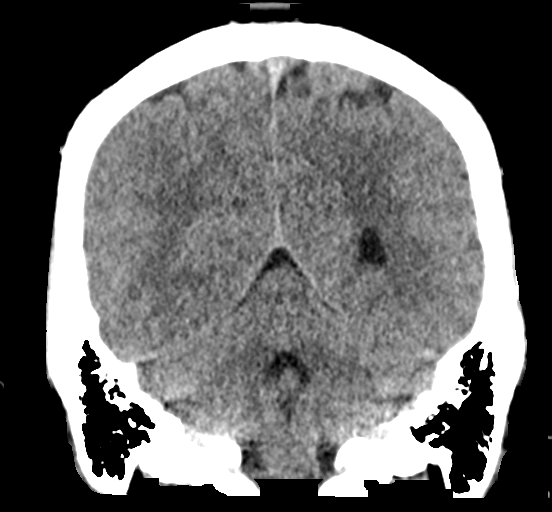
[im 31/70  brain]
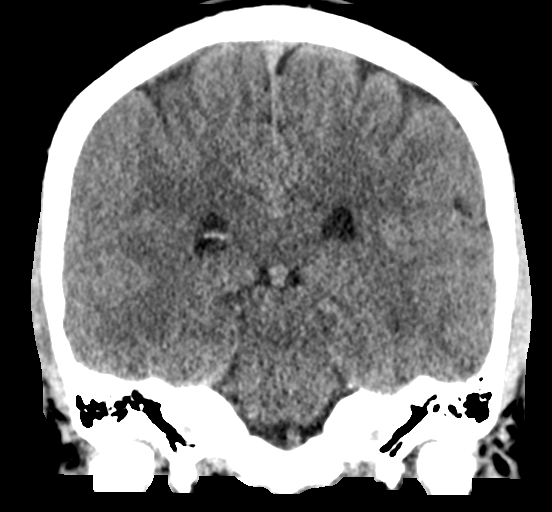
[im 39/70  brain]
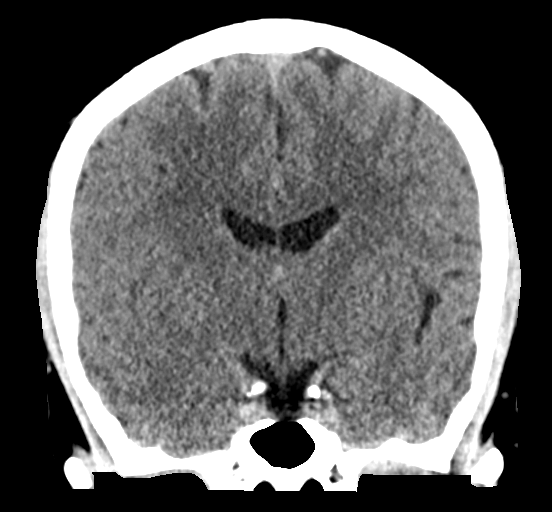

[Series 5: sagittal soft · sagittal · 0.32mm/px · 3 of 56 slices shown]
[im 19/56  brain]
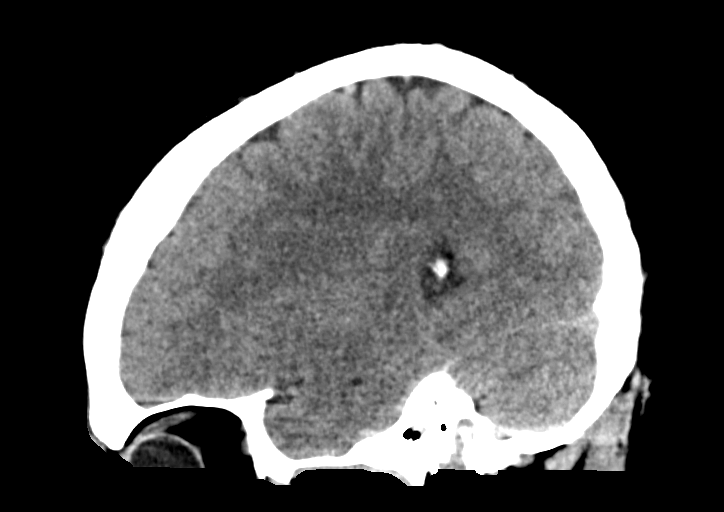
[im 28/56  brain]
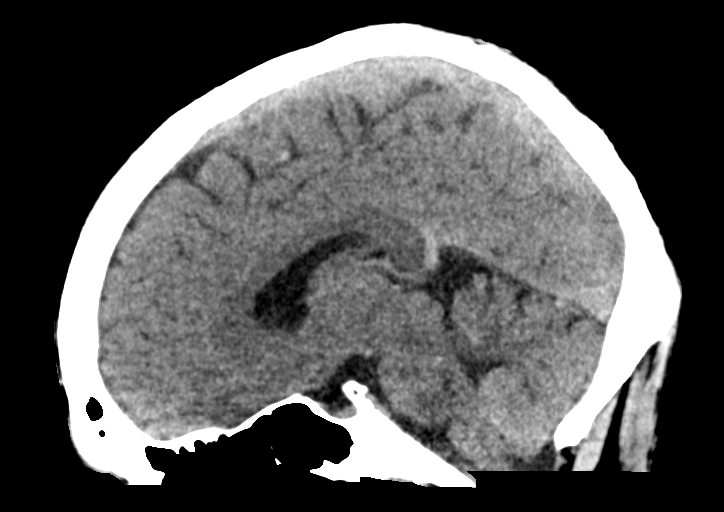
[im 37/56  brain]
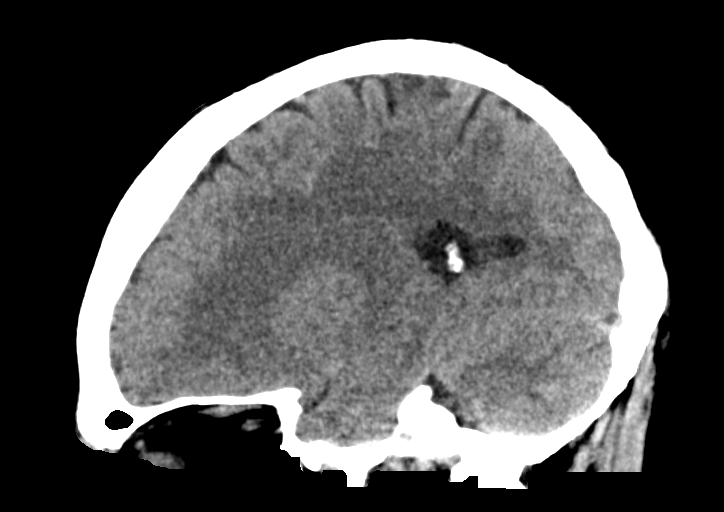

[16 of 47 positions shown; findings below may reference images not displayed]

FINDINGS: Brain: No evidence of acute infarction, hemorrhage, hydrocephalus,
extra-axial collection or mass lesion/mass effect.

Vascular: No hyperdense vessel or unexpected calcification.

Skull: Normal. Negative for fracture or focal lesion.

Sinuses/Orbits: Paranasal sinuses and mastoid air cells are clear.
Orbits appear normal.

Other: None
IMPRESSION: No acute intracranial abnormalities. Normal brain.

## 2022-04-07 DIAGNOSIS — R42 Dizziness and giddiness: Secondary | ICD-10-CM | POA: Diagnosis not present

## 2022-04-07 DIAGNOSIS — R079 Chest pain, unspecified: Secondary | ICD-10-CM | POA: Diagnosis not present

## 2022-04-07 DIAGNOSIS — Z0000 Encounter for general adult medical examination without abnormal findings: Secondary | ICD-10-CM | POA: Diagnosis not present

## 2022-04-08 DIAGNOSIS — Z13220 Encounter for screening for lipoid disorders: Secondary | ICD-10-CM | POA: Diagnosis not present

## 2022-04-08 DIAGNOSIS — R079 Chest pain, unspecified: Secondary | ICD-10-CM | POA: Diagnosis not present

## 2022-04-08 DIAGNOSIS — R42 Dizziness and giddiness: Secondary | ICD-10-CM | POA: Diagnosis not present

## 2022-04-23 DIAGNOSIS — G4700 Insomnia, unspecified: Secondary | ICD-10-CM | POA: Diagnosis not present

## 2022-04-23 DIAGNOSIS — F209 Schizophrenia, unspecified: Secondary | ICD-10-CM | POA: Diagnosis not present

## 2022-04-23 DIAGNOSIS — F419 Anxiety disorder, unspecified: Secondary | ICD-10-CM | POA: Diagnosis not present

## 2022-05-08 DIAGNOSIS — K649 Unspecified hemorrhoids: Secondary | ICD-10-CM | POA: Diagnosis not present

## 2022-05-08 DIAGNOSIS — R051 Acute cough: Secondary | ICD-10-CM | POA: Diagnosis not present

## 2022-05-21 DIAGNOSIS — F209 Schizophrenia, unspecified: Secondary | ICD-10-CM | POA: Diagnosis not present

## 2022-07-30 DIAGNOSIS — F209 Schizophrenia, unspecified: Secondary | ICD-10-CM | POA: Diagnosis not present

## 2022-08-17 DIAGNOSIS — M79671 Pain in right foot: Secondary | ICD-10-CM | POA: Diagnosis not present

## 2022-08-26 DIAGNOSIS — F209 Schizophrenia, unspecified: Secondary | ICD-10-CM | POA: Diagnosis not present

## 2022-09-11 DIAGNOSIS — R509 Fever, unspecified: Secondary | ICD-10-CM | POA: Diagnosis not present

## 2022-09-11 DIAGNOSIS — J069 Acute upper respiratory infection, unspecified: Secondary | ICD-10-CM | POA: Diagnosis not present

## 2022-09-11 DIAGNOSIS — R051 Acute cough: Secondary | ICD-10-CM | POA: Diagnosis not present

## 2022-09-11 DIAGNOSIS — R52 Pain, unspecified: Secondary | ICD-10-CM | POA: Diagnosis not present

## 2022-09-11 DIAGNOSIS — Z03818 Encounter for observation for suspected exposure to other biological agents ruled out: Secondary | ICD-10-CM | POA: Diagnosis not present

## 2022-11-04 DIAGNOSIS — F209 Schizophrenia, unspecified: Secondary | ICD-10-CM | POA: Diagnosis not present

## 2022-12-31 DIAGNOSIS — Z79899 Other long term (current) drug therapy: Secondary | ICD-10-CM | POA: Diagnosis not present

## 2022-12-31 DIAGNOSIS — F209 Schizophrenia, unspecified: Secondary | ICD-10-CM | POA: Diagnosis not present

## 2023-04-01 DIAGNOSIS — F209 Schizophrenia, unspecified: Secondary | ICD-10-CM | POA: Diagnosis not present

## 2023-04-11 DIAGNOSIS — Z113 Encounter for screening for infections with a predominantly sexual mode of transmission: Secondary | ICD-10-CM | POA: Diagnosis not present

## 2023-04-11 DIAGNOSIS — R1032 Left lower quadrant pain: Secondary | ICD-10-CM | POA: Diagnosis not present

## 2023-04-29 DIAGNOSIS — Z0000 Encounter for general adult medical examination without abnormal findings: Secondary | ICD-10-CM | POA: Diagnosis not present

## 2023-04-29 DIAGNOSIS — Z13220 Encounter for screening for lipoid disorders: Secondary | ICD-10-CM | POA: Diagnosis not present

## 2023-07-23 DIAGNOSIS — F209 Schizophrenia, unspecified: Secondary | ICD-10-CM | POA: Diagnosis not present

## 2023-08-09 DIAGNOSIS — T8130XA Disruption of wound, unspecified, initial encounter: Secondary | ICD-10-CM | POA: Diagnosis not present

## 2023-08-23 ENCOUNTER — Emergency Department (HOSPITAL_BASED_OUTPATIENT_CLINIC_OR_DEPARTMENT_OTHER)
Admission: EM | Admit: 2023-08-23 | Discharge: 2023-08-23 | Disposition: A | Discharge status: Home / Self Care | Payer: BC Managed Care – PPO

## 2023-08-23 ENCOUNTER — Encounter (HOSPITAL_BASED_OUTPATIENT_CLINIC_OR_DEPARTMENT_OTHER): Payer: Self-pay | Admitting: Emergency Medicine

## 2023-08-23 ENCOUNTER — Other Ambulatory Visit: Payer: Self-pay

## 2023-08-23 DIAGNOSIS — Z20822 Contact with and (suspected) exposure to covid-19: Secondary | ICD-10-CM | POA: Diagnosis not present

## 2023-08-23 DIAGNOSIS — K529 Noninfective gastroenteritis and colitis, unspecified: Secondary | ICD-10-CM | POA: Diagnosis not present

## 2023-08-23 DIAGNOSIS — R197 Diarrhea, unspecified: Secondary | ICD-10-CM | POA: Diagnosis not present

## 2023-08-23 LAB — COMPREHENSIVE METABOLIC PANEL
ALT: 28 U/L (ref 0–44)
AST: 21 U/L (ref 15–41)
Albumin: 4.4 g/dL (ref 3.5–5.0)
Alkaline Phosphatase: 86 U/L (ref 38–126)
Anion gap: 5 (ref 5–15)
BUN: 13 mg/dL (ref 6–20)
CO2: 30 mmol/L (ref 22–32)
Calcium: 9.1 mg/dL (ref 8.9–10.3)
Chloride: 103 mmol/L (ref 98–111)
Creatinine, Ser: 0.89 mg/dL (ref 0.61–1.24)
GFR, Estimated: >60 mL/min (ref >60)
Glucose, Bld: 132 mg/dL — ABNORMAL HIGH (ref 70–99)
Potassium: 4.7 mmol/L (ref 3.5–5.1)
Sodium: 138 mmol/L (ref 135–145)
Total Bilirubin: 0.4 mg/dL (ref <1.2)
Total Protein: 7 g/dL (ref 6.5–8.1)

## 2023-08-23 LAB — URINALYSIS, ROUTINE W REFLEX MICROSCOPIC
Bilirubin Urine: NEGATIVE
Glucose, UA: NEGATIVE mg/dL
Hgb urine dipstick: NEGATIVE
Ketones, ur: NEGATIVE mg/dL
Leukocytes,Ua: NEGATIVE
Nitrite: NEGATIVE
Protein, ur: NEGATIVE mg/dL
Specific Gravity, Urine: 1.025 (ref 1.005–1.030)
pH: 5.5 (ref 5.0–8.0)

## 2023-08-23 LAB — CBC
HCT: 47.2 % (ref 39.0–52.0)
Hemoglobin: 15.4 g/dL (ref 13.0–17.0)
MCH: 29.6 pg (ref 26.0–34.0)
MCHC: 32.6 g/dL (ref 30.0–36.0)
MCV: 90.6 fL (ref 80.0–100.0)
Platelets: 239 10*3/uL (ref 150–400)
RBC: 5.21 MIL/uL (ref 4.22–5.81)
RDW: 12.6 % (ref 11.5–15.5)
WBC: 9.5 10*3/uL (ref 4.0–10.5)
nRBC: 0 % (ref 0.0–0.2)

## 2023-08-23 LAB — RESP PANEL BY RT-PCR (RSV, FLU A&B, COVID)  RVPGX2
Influenza A by PCR: NEGATIVE
Influenza B by PCR: NEGATIVE
Resp Syncytial Virus by PCR: NEGATIVE
SARS Coronavirus 2 by RT PCR: NEGATIVE

## 2023-08-23 LAB — LIPASE, BLOOD: Lipase: 20 U/L (ref 11–51)

## 2023-08-23 MED ORDER — ONDANSETRON HCL 4 MG PO TABS: 4.0000 mg | ORAL_TABLET | Freq: Four times a day (QID) | ORAL | 0 refills | Status: AC

## 2023-08-23 MED ORDER — DICYCLOMINE HCL 10 MG PO CAPS
10.0000 mg | ORAL_CAPSULE | Freq: Once | ORAL | Status: AC
  Administered 2023-08-23: 10 mg via ORAL
  Filled 2023-08-23: qty 1

## 2023-08-23 MED ORDER — DICYCLOMINE HCL 20 MG PO TABS: 20.0000 mg | ORAL_TABLET | Freq: Two times a day (BID) | ORAL | 0 refills | Status: AC | PRN

## 2023-08-23 MED ORDER — SODIUM CHLORIDE 0.9 % IV BOLUS
1000.0000 mL | Freq: Once | INTRAVENOUS | Status: AC
  Administered 2023-08-23: 1000 mL via INTRAVENOUS

## 2023-08-23 MED ORDER — ONDANSETRON HCL 4 MG/2ML IJ SOLN
4.0000 mg | Freq: Once | INTRAMUSCULAR | Status: AC
  Administered 2023-08-23: 4 mg via INTRAVENOUS
  Filled 2023-08-23: qty 2

## 2023-08-23 MED ORDER — LOPERAMIDE HCL 2 MG PO CAPS: 2.0000 mg | ORAL_CAPSULE | Freq: Four times a day (QID) | ORAL | 0 refills | Status: AC | PRN

## 2023-08-23 MED ORDER — ONDANSETRON 4 MG PO TBDP: 4.0000 mg | ORAL_TABLET | Freq: Three times a day (TID) | ORAL | 0 refills | Status: AC | PRN

## 2023-08-23 NOTE — ED Notes (Signed)
 Discharge instructions, follow up care, and prescriptions reviewed and explained, pt verbalized understanding and had no further questions on d/c. Pt caox4, ambulatory, NAD on d/c.

## 2023-08-23 NOTE — ED Triage Notes (Signed)
Pt caox4, ambulatory c/o periumbilical abd pain with diarrhea since approx 1900 last night, worsens hourly with diarrhea. Pt denies vomiting but reports some nausea at times. Pt states wife was recently sick but with different s/s.

## 2023-08-23 NOTE — ED Provider Notes (Signed)
Glenn Hodge EMERGENCY DEPARTMENT AT [facility] Provider Note   CSN: [id number] Arrival date & time: 08/23/23  6213     History  Chief Complaint  Patient presents with   Abdominal Pain    Glenn Hodge is a 42 y.o. male.  This is a 42 year old male presenting emergency department for crampy abdominal pain associated with diarrhea.  Reports symptoms started last night and had numerous episodes of nonbloody, watery diarrhea.  Reports pain largely periumbilical.  Was worse last night, has somewhat improved this morning.  No fevers chills chest pain shortness of breath.  No vomiting.  Reports family members have been sick as well.   Abdominal Pain      Home Medications Prior to Admission medications   Medication Sig Start Date End Date Taking? Authorizing Provider  dicyclomine (BENTYL) 20 MG tablet Take 1 tablet (20 mg total) by mouth 2 (two) times daily as needed for up to 5 days for spasms. 08/23/23 08/28/23 Yes Coral Spikes, DO  loperamide (IMODIUM) 2 MG capsule Take 1 capsule (2 mg total) by mouth 4 (four) times daily as needed for diarrhea or loose stools. 08/23/23  Yes Ziya Coonrod, Feliz Beam J, DO  ondansetron (ZOFRAN) 4 MG tablet Take 1 tablet (4 mg total) by mouth every 6 (six) hours. 08/23/23  Yes Coral Spikes, DO  ARIPiprazole (ABILIFY) 10 MG tablet aripiprazole 10 mg tablet 08/25/20   [provider]  Doxepin HCl 3 MG TABS doxepin 3 mg tablet 08/26/20   [provider]  hydrOXYzine (ATARAX/VISTARIL) 25 MG tablet hydroxyzine HCl 25 mg tablet  TAKE 1 TABLET (25 MG TOTAL) BY MOUTH THREE (3) TIMES A DAY AS NEEDED FOR ANXIETY. 05/17/20   [provider]  meclizine (ANTIVERT) 25 MG tablet Take 1 tablet (25 mg total) by mouth 3 (three) times daily as needed for dizziness. 05/02/21   Jacalyn Lefevre, MD  ondansetron (ZOFRAN ODT) 4 MG disintegrating tablet Take 1 tablet (4 mg total) by mouth every 8 (eight) hours as needed for nausea or vomiting. 11/08/20    Rhys Martini, PA-C      Allergies    Patient has no known allergies.    Review of Systems   Review of Systems  Gastrointestinal:  Positive for abdominal pain.    Physical Exam Updated Vital Signs BP (!) 141/83   Pulse 68   Temp (!) 97.5 F (36.4 C) (Oral)   Resp 20   Ht 6' (1.829 m)   Wt 81.6 kg   SpO2 99%   BMI 24.41 kg/m  Physical Exam Vitals and nursing note reviewed.  Constitutional:      General: He is not in acute distress.    Appearance: He is not toxic-appearing.  HENT:     Head: Normocephalic.     Nose: Nose normal.     Mouth/Throat:     Mouth: Mucous membranes are dry.  Cardiovascular:     Rate and Rhythm: Normal rate and regular rhythm.  Abdominal:     General: Abdomen is flat.     Palpations: Abdomen is soft.     Tenderness: There is no abdominal tenderness. There is no guarding or rebound.  Genitourinary:    Prostate: Normal.     Rectum: Normal.  Skin:    General: Skin is warm and dry.     Capillary Refill: Capillary refill takes less than 2 seconds.  Neurological:     Mental Status: He is alert and oriented to person, place, and time.  Psychiatric:        Mood and Affect: Mood normal.        Behavior: Behavior normal.     ED Results / Procedures / Treatments   Labs (all labs ordered are listed, but only abnormal results are displayed) Labs Reviewed  COMPREHENSIVE METABOLIC PANEL - Abnormal; Notable for the following components:      Result Value   Glucose, Bld 132 (*)    All other components within normal limits  RESP PANEL BY RT-PCR (RSV, FLU A&B, COVID)  RVPGX2  LIPASE, BLOOD  CBC  URINALYSIS, ROUTINE W REFLEX MICROSCOPIC    EKG None  Radiology No results found.  Procedures Procedures    Medications Ordered in ED Medications  sodium chloride 0.9 % bolus 1,000 mL (0 mLs Intravenous Stopped 08/23/23 0839)  ondansetron (ZOFRAN) injection 4 mg (4 mg Intravenous Given 08/23/23 0738)  dicyclomine (BENTYL) capsule 10 mg (10 mg  Oral Given 08/23/23 0735)    ED Course/ Medical Decision Making/ A&P Clinical Course as of 08/23/23 0848  Mon Aug 23, 2023  0715 CBC No leukocytosis to suggest systemic infection.  [TY]  0800 Resp panel by RT-PCR (RSV, Flu A&B, Covid) Anterior Nasal Swab Flu COVID RSV negative [TY]  0800 Comprehensive metabolic panel(!) No significant metabolic derangement.  No acute kidney injury.  No transaminitis to suggest hepatobiliary disease. [TY]  0800 Lipase: 20 Pancreatitis unlikely. [TY]  [id number] Urinalysis, Routine w reflex microscopic -Urine, Clean Catch Negative [TY]  0848 Patient feeling improved after IV fluids.  Workup today reassuring as noted in ED course.  Again, low suspicion for acute intra-abdominal pathology.  Will discharge with supportive medications for gastroenteritis type picture. [TY]    Clinical Course User Index [TY] Coral Spikes, DO                                 Medical Decision Making Is a well-appearing 42 year old male presenting emergency department for what sounds like gastroenteritis.  He is afebrile nontachycardic, slightly elevated blood pressure.  Maintaining oxygen saturation on room air.  On exam he is a soft nontender abdomen.  Does have sick contacts at home with similar symptoms.  Suspect of viral etiology.  Flu/COVID/RSV ordered.  Will get basic screening labs as well to exclude other intraabdominal sources of his symptoms.  Low suspicion on exam patient has acute surgical pathology such as appendicitis.  Amount and/or Complexity of Data Reviewed External Data Reviewed:     Details: Per chart review appears that he has a history of schizophrenia, but does not appear to be taking medications such as lithium that could cause his symptoms Labs: ordered. Decision-making details documented in ED Course. Radiology:     Details: Considered CT scan, however given vitals and physical exam and history.  Low suspicion for acute surgical  pathology.  Risk Prescription drug management. Decision regarding hospitalization.           Final Clinical Impression(s) / ED Diagnoses Final diagnoses:  Gastroenteritis    Rx / DC Orders ED Discharge Orders          Ordered    ondansetron (ZOFRAN) 4 MG tablet  Every 6 hours        08/23/23 0847    dicyclomine (BENTYL) 20 MG tablet  2 times daily PRN        08/23/23 0847    loperamide (IMODIUM) 2 MG capsule  4 times daily  PRN        08/23/23 0847              Coral Spikes, DO 08/23/23 [id number]

## 2023-08-23 NOTE — Discharge Instructions (Addendum)
Please follow-up with your primary doctor.  Please try to maintain adequate hydration as discussed with frequent sips of liquids.  May take medications for supportive relief.  Please return if develop fevers, chills, severe pain, nausea and vomiting and unable to keep food down, lightheadedness, chest pain, shortness of breath or any new or worsening symptoms that are concerning to you.

## 2023-09-30 DIAGNOSIS — F209 Schizophrenia, unspecified: Secondary | ICD-10-CM | POA: Diagnosis not present

## 2023-11-12 DIAGNOSIS — G4700 Insomnia, unspecified: Secondary | ICD-10-CM | POA: Diagnosis not present

## 2023-11-12 DIAGNOSIS — F209 Schizophrenia, unspecified: Secondary | ICD-10-CM | POA: Diagnosis not present

## 2023-12-17 DIAGNOSIS — M79671 Pain in right foot: Secondary | ICD-10-CM | POA: Diagnosis not present

## 2023-12-17 DIAGNOSIS — M898X7 Other specified disorders of bone, ankle and foot: Secondary | ICD-10-CM | POA: Diagnosis not present

## 2024-03-31 DIAGNOSIS — F209 Schizophrenia, unspecified: Secondary | ICD-10-CM | POA: Diagnosis not present

## 2024-03-31 DIAGNOSIS — G4700 Insomnia, unspecified: Secondary | ICD-10-CM | POA: Diagnosis not present

## 2024-04-20 DIAGNOSIS — F209 Schizophrenia, unspecified: Secondary | ICD-10-CM | POA: Diagnosis not present

## 2024-05-10 DIAGNOSIS — E663 Overweight: Secondary | ICD-10-CM | POA: Diagnosis not present

## 2024-05-10 DIAGNOSIS — E782 Mixed hyperlipidemia: Secondary | ICD-10-CM | POA: Diagnosis not present

## 2024-05-10 DIAGNOSIS — Z0000 Encounter for general adult medical examination without abnormal findings: Secondary | ICD-10-CM | POA: Diagnosis not present

## 2024-05-10 DIAGNOSIS — Z6827 Body mass index (BMI) 27.0-27.9, adult: Secondary | ICD-10-CM | POA: Diagnosis not present

## 2024-05-12 DIAGNOSIS — E663 Overweight: Secondary | ICD-10-CM | POA: Diagnosis not present

## 2024-05-12 DIAGNOSIS — E782 Mixed hyperlipidemia: Secondary | ICD-10-CM | POA: Diagnosis not present

## 2024-07-19 ENCOUNTER — Ambulatory Visit

## 2024-07-19 DIAGNOSIS — K649 Unspecified hemorrhoids: Secondary | ICD-10-CM | POA: Diagnosis not present

## 2024-08-03 DIAGNOSIS — K6289 Other specified diseases of anus and rectum: Secondary | ICD-10-CM | POA: Diagnosis not present

## 2024-08-03 DIAGNOSIS — Z1211 Encounter for screening for malignant neoplasm of colon: Secondary | ICD-10-CM | POA: Diagnosis not present

## 2024-08-03 DIAGNOSIS — K648 Other hemorrhoids: Secondary | ICD-10-CM | POA: Diagnosis not present

## 2024-08-18 DIAGNOSIS — G4700 Insomnia, unspecified: Secondary | ICD-10-CM | POA: Diagnosis not present

## 2024-08-18 DIAGNOSIS — F209 Schizophrenia, unspecified: Secondary | ICD-10-CM | POA: Diagnosis not present

## 2024-09-01 DIAGNOSIS — K640 First degree hemorrhoids: Secondary | ICD-10-CM | POA: Diagnosis not present

## 2024-09-01 DIAGNOSIS — D120 Benign neoplasm of cecum: Secondary | ICD-10-CM | POA: Diagnosis not present

## 2024-09-01 DIAGNOSIS — Z1211 Encounter for screening for malignant neoplasm of colon: Secondary | ICD-10-CM | POA: Diagnosis not present

## 2024-09-01 DIAGNOSIS — K6289 Other specified diseases of anus and rectum: Secondary | ICD-10-CM | POA: Diagnosis not present

## 2024-09-01 DIAGNOSIS — K5730 Diverticulosis of large intestine without perforation or abscess without bleeding: Secondary | ICD-10-CM | POA: Diagnosis not present

## 2024-09-01 DIAGNOSIS — D123 Benign neoplasm of transverse colon: Secondary | ICD-10-CM | POA: Diagnosis not present

## 2024-09-01 DIAGNOSIS — K644 Residual hemorrhoidal skin tags: Secondary | ICD-10-CM | POA: Diagnosis not present

## 2024-09-05 ENCOUNTER — Ambulatory Visit: Payer: Self-pay | Admitting: Surgery

## 2024-09-05 DIAGNOSIS — Z860101 Personal history of adenomatous and serrated colon polyps: Secondary | ICD-10-CM | POA: Diagnosis not present

## 2024-09-05 DIAGNOSIS — K641 Second degree hemorrhoids: Secondary | ICD-10-CM | POA: Diagnosis not present

## 2024-09-05 DIAGNOSIS — K645 Perianal venous thrombosis: Secondary | ICD-10-CM | POA: Diagnosis not present

## 2024-09-05 DIAGNOSIS — K594 Anal spasm: Secondary | ICD-10-CM | POA: Diagnosis not present
